# Patient Record
Sex: Female | Born: 1985
Health system: Southern US, Community
[De-identification: ages and names within clinical notes are randomized; demographics above are authoritative.]

## PROBLEM LIST (undated history)

## (undated) ENCOUNTER — Inpatient Hospital Stay (HOSPITAL_COMMUNITY): Payer: Self-pay

## (undated) DIAGNOSIS — F419 Anxiety disorder, unspecified: Secondary | ICD-10-CM

## (undated) DIAGNOSIS — N200 Calculus of kidney: Secondary | ICD-10-CM

## (undated) HISTORY — PX: BREAST RECONSTRUCTION: SHX9

## (undated) HISTORY — PX: BREAST CYST EXCISION: SHX579

---

## 2007-05-25 ENCOUNTER — Ambulatory Visit: Payer: Self-pay | Admitting: Obstetrics & Gynecology

## 2007-05-25 ENCOUNTER — Observation Stay (HOSPITAL_COMMUNITY): Admission: AD | Admit: 2007-05-25 | Discharge: 2007-05-26 | Payer: Self-pay | Admitting: Obstetrics & Gynecology

## 2007-05-26 ENCOUNTER — Ambulatory Visit: Payer: Self-pay | Admitting: Vascular Surgery

## 2011-04-28 NOTE — Discharge Summary (Signed)
Anna Marshall, Anna Marshall                ACCOUNT NO.:  [id number]   MEDICAL RECORD NO.:  [id number]          PATIENT TYPE:  OBV   LOCATION:  9317                          FACILITY:  [facility]   PHYSICIAN:  Lesly Dukes, M.D. DATE OF BIRTH:  August 02, 1986   DATE OF ADMISSION:  05/25/2007  DATE OF DISCHARGE:  05/26/2007                               DISCHARGE SUMMARY   ADMISSION DIAGNOSIS:  Rule out pyelonephritis, left-sided back pain,  possible kidney stone.   DISCHARGE DIAGNOSIS:  Likely kidney stone.   PERTINENT LABS:  Urinalysis with moderate blood, 100 protein, positive  nitrites.  Urine culture was negative. Wet prep was negative.  White  blood cell count was 9.8, hemoglobin 10.3. MRI of the abdomen, pelvis  showed slight fullness of the collecting system bilaterally.  There was  minimal amount of perinephric fluid on the left which may be related to  the result of recently passed stone versus mild pyelonephritis.   HOSPITAL COURSE:  This is a 25 year old gravida 1 at 19+ weeks who  presented with complaints of vaginal pain and left-sided back pain.  She  was seen at urgent care earlier that day and was diagnosed with a UTI  and given Macrobid. She continued to have left flank pain and presented  to MAU. She gave a history of subjective fevers and chills together with  the back pain.  She was admitted for observation to rule out  pyelonephritis versus a kidney stone. She was started on Rocephin 1 gram  q.12 hours, Dilaudid for pain management, given IV fluids and an  antiemetic. Her urine was strained looking for a kidney stone. Overnight  she remained afebrile.  She still complained of left-sided soreness that  had been gradually getting better with the pain medication. She also  complained of some pain in the left lower extremity. Given her recent  history of flying from [place] and the patient being pregnant lower  extremity Dopplers were done to rule out DVT which was negative.  The  patient remained stable.  Her pain was well-controlled with dilated and  was switched over to Percocet also well controlled on that. Her urine  culture was negative.  She was discharged home with instructions to  strain all urine looking for kidney stone. She is also in the process of  finding a primary OB doctor. [place] and believed a family  member has contact with an OB doctor there. She was given the number to  the [facility] in the event that she is unable to locate a  primary doctor.  She was discharged home on the following medicines.   DISCHARGE MEDICATIONS:  1. Percocet 5/325 mg take 1-2 tablets p.o. q.6 hours p.r.n. pain.  2. Macrobid 100 mg p.o. b.i.d. x7 days.  3. Prenatal vitamins daily.  4. Phenergan 12.5 mg q.6 hours p.r.n.     ______________________________  Paticia Stack, MD      Lesly Dukes, M.D.  Electronically Signed    LNJ/MEDQ  D:  07/24/2007  T:  07/24/2007  Job:  [id number]

## 2011-09-28 LAB — URINE CULTURE: Colony Count: NO GROWTH

## 2011-09-28 LAB — URINALYSIS, ROUTINE W REFLEX MICROSCOPIC
Bilirubin Urine: NEGATIVE
Ketones, ur: 15 — AB
Leukocytes, UA: NEGATIVE
Nitrite: POSITIVE — AB
Specific Gravity, Urine: 1.02
Urobilinogen, UA: >8 — ABNORMAL HIGH
pH: 6.5

## 2011-09-28 LAB — CBC
HCT: 29.5 — ABNORMAL LOW
Hemoglobin: 10.3 — ABNORMAL LOW
RBC: 3.24 — ABNORMAL LOW

## 2011-09-28 LAB — WET PREP, GENITAL: Clue Cells Wet Prep HPF POC: NONE SEEN

## 2011-09-28 LAB — URINE MICROSCOPIC-ADD ON

## 2015-07-02 ENCOUNTER — Inpatient Hospital Stay (HOSPITAL_COMMUNITY): Payer: 59

## 2015-07-02 ENCOUNTER — Inpatient Hospital Stay (HOSPITAL_COMMUNITY)
Admission: AD | Admit: 2015-07-02 | Discharge: 2015-07-02 | Disposition: A | Discharge status: Home / Self Care | Payer: 59 | Source: Ambulatory Visit | Attending: Obstetrics and Gynecology | Admitting: Obstetrics and Gynecology

## 2015-07-02 ENCOUNTER — Encounter (HOSPITAL_COMMUNITY): Payer: Self-pay | Admitting: *Deleted

## 2015-07-02 DIAGNOSIS — O26899 Other specified pregnancy related conditions, unspecified trimester: Secondary | ICD-10-CM

## 2015-07-02 DIAGNOSIS — R1032 Left lower quadrant pain: Secondary | ICD-10-CM | POA: Insufficient documentation

## 2015-07-02 DIAGNOSIS — Z87442 Personal history of urinary calculi: Secondary | ICD-10-CM | POA: Diagnosis not present

## 2015-07-02 DIAGNOSIS — F17210 Nicotine dependence, cigarettes, uncomplicated: Secondary | ICD-10-CM | POA: Diagnosis not present

## 2015-07-02 DIAGNOSIS — Z3A00 Weeks of gestation of pregnancy not specified: Secondary | ICD-10-CM | POA: Diagnosis not present

## 2015-07-02 DIAGNOSIS — O3680X0 Pregnancy with inconclusive fetal viability, not applicable or unspecified: Secondary | ICD-10-CM

## 2015-07-02 DIAGNOSIS — R109 Unspecified abdominal pain: Secondary | ICD-10-CM

## 2015-07-02 DIAGNOSIS — O9989 Other specified diseases and conditions complicating pregnancy, childbirth and the puerperium: Secondary | ICD-10-CM | POA: Insufficient documentation

## 2015-07-02 HISTORY — DX: Anxiety disorder, unspecified: F41.9

## 2015-07-02 HISTORY — DX: Calculus of kidney: N20.0

## 2015-07-02 LAB — URINALYSIS, ROUTINE W REFLEX MICROSCOPIC
Bilirubin Urine: NEGATIVE
Glucose, UA: NEGATIVE mg/dL
HGB URINE DIPSTICK: NEGATIVE
KETONES UR: NEGATIVE mg/dL
LEUKOCYTES UA: NEGATIVE
Nitrite: NEGATIVE
PH: 5 (ref 5.0–8.0)
Protein, ur: NEGATIVE mg/dL
SPECIFIC GRAVITY, URINE: 1.005 (ref 1.005–1.030)
UROBILINOGEN UA: 0.2 mg/dL (ref 0.0–1.0)

## 2015-07-02 LAB — CBC
HCT: 38.3 % (ref 36.0–46.0)
HEMOGLOBIN: 13.7 g/dL (ref 12.0–15.0)
MCH: 31.1 pg (ref 26.0–34.0)
MCHC: 35.8 g/dL (ref 30.0–36.0)
MCV: 87 fL (ref 78.0–100.0)
PLATELETS: 225 10*3/uL (ref 150–400)
RBC: 4.4 MIL/uL (ref 3.87–5.11)
RDW: 13.6 % (ref 11.5–15.5)
WBC: 9.7 10*3/uL (ref 4.0–10.5)

## 2015-07-02 LAB — WET PREP, GENITAL
CLUE CELLS WET PREP: NONE SEEN
TRICH WET PREP: NONE SEEN
YEAST WET PREP: NONE SEEN

## 2015-07-02 LAB — GC/CHLAMYDIA PROBE AMP (~~LOC~~) NOT AT ARMC
CHLAMYDIA, DNA PROBE: NEGATIVE
Neisseria Gonorrhea: NEGATIVE

## 2015-07-02 LAB — HCG, QUANTITATIVE, PREGNANCY: hCG, Beta Chain, Quant, S: 63 m[IU]/mL — ABNORMAL HIGH (ref <5)

## 2015-07-02 LAB — ABO/RH: ABO/RH(D): A POS

## 2015-07-02 LAB — POCT PREGNANCY, URINE: Preg Test, Ur: POSITIVE — AB

## 2015-07-02 NOTE — MAU Note (Signed)
Pt reports her IUD was removed on 06/22 and LMP was 06/09. Positive preg test on Monday. Has had lower abd cramping x 1-2 weeks, sharp shooting pain on left side for a few days.

## 2015-07-02 NOTE — Discharge Instructions (Signed)

## 2015-07-02 NOTE — MAU Provider Note (Signed)
Chief Complaint: No chief complaint on file.   First Provider Initiated Contact with Patient 07/02/15 [phone]      SUBJECTIVE HPI: Anna Marshall is a 29 y.o. [id number] at Unknown by LMP who presents to maternity admissions reporting LLQ abdominal pain x 1 week with positive HPT 4 days ago and labs in the office yesterday with quant hcg of 33.  She reports she had her Paraguard IUD out on 6/22, following her LMP on 6/9, then started having the sharp intermittent pain 1 week ago.  She called her MD office and was seen yesterday for labs. The pain became more severe today, bringing her to the MAU. She has tried Tylenol for the pain which has not helped.  She denies vaginal bleeding, vaginal itching/burning, urinary symptoms, h/a, dizziness, n/v, or fever/chills.     Abdominal Pain This is a new problem. The current episode started 1 to 4 weeks ago. The onset quality is sudden. The problem occurs intermittently. The most recent episode lasted 7 days. The problem has been gradually worsening. The pain is located in the LLQ. The pain is moderate. The quality of the pain is sharp. The abdominal pain does not radiate. Pertinent negatives include no constipation, diarrhea, dysuria, fever, frequency, headaches, nausea or vomiting. She has tried acetaminophen for the symptoms. The treatment provided no relief.    Past Medical History  Diagnosis Date  . Kidney stones   . Anxiety    Past Surgical History  Procedure Laterality Date  . Breast cyst excision    . Breast reconstruction     History   Social History  . Marital Status: Married    Spouse Name: N/A  . Number of Children: N/A  . Years of Education: N/A   Occupational History  . Not on file.   Social History Main Topics  . Smoking status: Current Every Day Smoker  . Smokeless tobacco: Not on file  . Alcohol Use: No  . Drug Use: No  . Sexual Activity: Yes    Birth Control/ Protection: None   Other Topics Concern  . Not on file    Social History Narrative  . No narrative on file   No current facility-administered medications on file prior to encounter.   No current outpatient prescriptions on file prior to encounter.   No Known Allergies  Review of Systems  Constitutional: Negative for fever, chills and malaise/fatigue.  Eyes: Negative for blurred vision.  Respiratory: Negative for cough and shortness of breath.   Cardiovascular: Negative for chest pain.  Gastrointestinal: Positive for abdominal pain. Negative for heartburn, nausea, vomiting, diarrhea and constipation.  Genitourinary: Negative for dysuria, urgency and frequency.  Musculoskeletal: Negative.   Neurological: Negative for dizziness and headaches.  Psychiatric/Behavioral: Negative for depression.    OBJECTIVE Blood pressure 123/77, pulse 115, resp. rate 16, height  (1.676 m), weight 50.803 kg (112 lb), last menstrual period 05/20/2015. GENERAL: Well-developed, well-nourished female in no acute distress.  EYES: normal sclera/conjunctiva; no lid-lag HENT: Atraumatic, normocephalic HEART: normal rate RESP: normal effort ABDOMEN: Soft, non-tender MUSCULOSKELETAL: Normal ROM EXTREMITIES: Nontender, no edema NEURO/PSYCH: Alert and oriented, appropriate affect  PELVIC EXAM: Cervix pink, visually closed, without lesion, scant white creamy discharge, vaginal walls and external genitalia normal Bimanual exam: Cervix 0/long/high, firm, anterior, neg CMT, uterus nontender, nonenlarged, adnexa without tenderness, enlargement, or mass   LAB RESULTS Results for orders placed or performed during the hospital encounter of 07/02/15 (from the past 24 hour(s))  Urinalysis, Routine w reflex  microscopic (not at [facility])     Status: Abnormal   Collection Time: 07/02/15 12:00 AM  Result Value Ref Range   Color, Urine STRAW (A) YELLOW   APPearance CLEAR CLEAR   Specific Gravity, Urine 1.005 1.005 - 1.030   pH 5.0 5.0 - 8.0   Glucose, UA NEGATIVE NEGATIVE  mg/dL   Hgb urine dipstick NEGATIVE NEGATIVE   Bilirubin Urine NEGATIVE NEGATIVE   Ketones, ur NEGATIVE NEGATIVE mg/dL   Protein, ur NEGATIVE NEGATIVE mg/dL   Urobilinogen, UA 0.2 0.0 - 1.0 mg/dL   Nitrite NEGATIVE NEGATIVE   Leukocytes, UA NEGATIVE NEGATIVE  Pregnancy, urine POC     Status: Abnormal   Collection Time: 07/02/15 12:40 AM  Result Value Ref Range   Preg Test, Ur POSITIVE (A) NEGATIVE  CBC     Status: None   Collection Time: 07/02/15 12:57 AM  Result Value Ref Range   WBC 9.7 4.0 - 10.5 K/uL   RBC 4.40 3.87 - 5.11 MIL/uL   Hemoglobin 13.7 12.0 - 15.0 g/dL   HCT 16.1 09.6 - 04.5 %   MCV 87.0 78.0 - 100.0 fL   MCH 31.1 26.0 - 34.0 pg   MCHC 35.8 30.0 - 36.0 g/dL   RDW 40.9 81.1 - 91.4 %   Platelets 225 150 - 400 K/uL  hCG, quantitative, pregnancy     Status: Abnormal   Collection Time: 07/02/15 12:57 AM  Result Value Ref Range   hCG, Beta Chain, Quant, S 63 (H) <5 mIU/mL  ABO/Rh     Status: None (Preliminary result)   Collection Time: 07/02/15 12:57 AM  Result Value Ref Range   ABO/RH(D) A POS   Wet prep, genital     Status: Abnormal   Collection Time: 07/02/15  3:00 AM  Result Value Ref Range   Yeast Wet Prep HPF POC NONE SEEN NONE SEEN   Trich, Wet Prep NONE SEEN NONE SEEN   Clue Cells Wet Prep HPF POC NONE SEEN NONE SEEN   WBC, Wet Prep HPF POC FEW (A) NONE SEEN    IMAGING US Ob Comp Less 14 Wks  07/02/2015   CLINICAL DATA:  29 year old female with prostate pregnancy test.  EXAM: OBSTETRIC <14 WK [place] AND TRANSVAGINAL OB US  TECHNIQUE: Both transabdominal and transvaginal ultrasound examinations were performed for complete evaluation of the gestation as well as the maternal uterus, adnexal regions, and pelvic cul-de-sac. Transvaginal technique was performed to assess early pregnancy.  COMPARISON:  None.  FINDINGS: The uterus appears unremarkable. No intrauterine pregnancy identified. The possibility of an ectopic pregnancy is not entirely excluded. Correlation  with serial HCG levels and follow-up with ultrasound is recommended.  The right ovary measures 3.1 x 1.5 x 2.1 cm and left ovary measures 2.3 x 2.6 by 3.9 cm. There is a 2.0 x 1.5 x 1.9 cm simple cyst in the right ovary. A cystic structure with surrounding flow within appearance of "Ring of fire" is noted in the left ovary, most compatible with a corpus luteum.  IMPRESSION: No intrauterine pregnancy identified. Correlation with clinical exam and follow-up with serial HCG levels and ultrasound recommended.  Probable left ovarian corpus luteum.   Electronically Signed   By: Elgie Collard M.D.   On: 07/02/2015 02:24   US Ob Transvaginal  07/02/2015   CLINICAL DATA:  29 year old female with prostate pregnancy test.  EXAM: OBSTETRIC <14 WK [place] AND TRANSVAGINAL OB US  TECHNIQUE: Both transabdominal and transvaginal ultrasound examinations were performed for complete evaluation  of the gestation as well as the maternal uterus, adnexal regions, and pelvic cul-de-sac. Transvaginal technique was performed to assess early pregnancy.  COMPARISON:  None.  FINDINGS: The uterus appears unremarkable. No intrauterine pregnancy identified. The possibility of an ectopic pregnancy is not entirely excluded. Correlation with serial HCG levels and follow-up with ultrasound is recommended.  The right ovary measures 3.1 x 1.5 x 2.1 cm and left ovary measures 2.3 x 2.6 by 3.9 cm. There is a 2.0 x 1.5 x 1.9 cm simple cyst in the right ovary. A cystic structure with surrounding flow within appearance of "Ring of fire" is noted in the left ovary, most compatible with a corpus luteum.  IMPRESSION: No intrauterine pregnancy identified. Correlation with clinical exam and follow-up with serial HCG levels and ultrasound recommended.  Probable left ovarian corpus luteum.   Electronically Signed   By: Elgie Collard M.D.   On: 07/02/2015 02:24    ASSESSMENT 1. Pregnancy of unknown anatomic location   2. Abdominal pain affecting pregnancy      PLAN Consult Dr Jackelyn Knife, reviewed assessment and labs Discharge home with ectopic precautions F/U in MAU in 48 hours for repeat quant hcg.  Pt reports concern about cost of ED visit. Discussed that labs are done in our clinic but that Monday is too late for recommended 48 hours.  Pt states understanding.   Return to MAU sooner as needed for emergencies    Medication List    TAKE these medications        clonazePAM 0.5 MG tablet  Commonly known as:  KLONOPIN  Take 0.25 mg by mouth 2 (two) times daily as needed for anxiety.       Follow-up Information    Follow up with [facility].   Specialty:  Obstetrics and Gynecology   Why:  Start prenatal care as soon as possible   Contact information:   [address] [place] [address] [phone]      Follow up with [facility] [place] MATERNITY ADMISSIONS.   Why:  In 48 hours for repeat labs or sooner as needed   Contact information:   [address] [id number] mc [place] [address] [phone]      Sharen Counter Certified Nurse-Midwife 07/02/2015  3:39 AM   Addendum: After talking with pt, she reports her physician is Dr Senaida Ores in [place], not Dr Senaida Ores at [facility].  She plans to move care to [facility] and was given list of providers in [place].

## 2015-08-08 ENCOUNTER — Inpatient Hospital Stay (HOSPITAL_COMMUNITY): Payer: Managed Care, Other (non HMO)

## 2015-08-08 ENCOUNTER — Inpatient Hospital Stay (HOSPITAL_COMMUNITY)
Admission: AD | Admit: 2015-08-08 | Discharge: 2015-08-08 | Disposition: A | Discharge status: Home / Self Care | Payer: Managed Care, Other (non HMO) | Source: Ambulatory Visit | Attending: Obstetrics & Gynecology | Admitting: Obstetrics & Gynecology

## 2015-08-08 ENCOUNTER — Encounter (HOSPITAL_COMMUNITY): Payer: Self-pay | Admitting: *Deleted

## 2015-08-08 DIAGNOSIS — M549 Dorsalgia, unspecified: Secondary | ICD-10-CM

## 2015-08-08 DIAGNOSIS — N831 Corpus luteum cyst: Secondary | ICD-10-CM | POA: Diagnosis not present

## 2015-08-08 DIAGNOSIS — O209 Hemorrhage in early pregnancy, unspecified: Secondary | ICD-10-CM

## 2015-08-08 DIAGNOSIS — O9989 Other specified diseases and conditions complicating pregnancy, childbirth and the puerperium: Secondary | ICD-10-CM

## 2015-08-08 DIAGNOSIS — O26899 Other specified pregnancy related conditions, unspecified trimester: Secondary | ICD-10-CM

## 2015-08-08 DIAGNOSIS — Z3A09 9 weeks gestation of pregnancy: Secondary | ICD-10-CM | POA: Insufficient documentation

## 2015-08-08 DIAGNOSIS — O4691 Antepartum hemorrhage, unspecified, first trimester: Secondary | ICD-10-CM

## 2015-08-08 DIAGNOSIS — O26891 Other specified pregnancy related conditions, first trimester: Secondary | ICD-10-CM | POA: Insufficient documentation

## 2015-08-08 DIAGNOSIS — O99891 Other specified diseases and conditions complicating pregnancy: Secondary | ICD-10-CM

## 2015-08-08 LAB — HCG, QUANTITATIVE, PREGNANCY: hCG, Beta Chain, Quant, S: 107345 m[IU]/mL — ABNORMAL HIGH (ref <5)

## 2015-08-08 LAB — COMPREHENSIVE METABOLIC PANEL
ALK PHOS: 39 U/L (ref 38–126)
ALT: 15 U/L (ref 14–54)
ANION GAP: 6 (ref 5–15)
AST: 15 U/L (ref 15–41)
Albumin: 4 g/dL (ref 3.5–5.0)
BILIRUBIN TOTAL: 0.7 mg/dL (ref 0.3–1.2)
BUN: 14 mg/dL (ref 6–20)
CALCIUM: 9.1 mg/dL (ref 8.9–10.3)
CO2: 26 mmol/L (ref 22–32)
CREATININE: 0.65 mg/dL (ref 0.44–1.00)
Chloride: 105 mmol/L (ref 101–111)
Glucose, Bld: 84 mg/dL (ref 65–99)
Potassium: 4.2 mmol/L (ref 3.5–5.1)
Sodium: 137 mmol/L (ref 135–145)
TOTAL PROTEIN: 6.7 g/dL (ref 6.5–8.1)

## 2015-08-08 LAB — CBC
HEMATOCRIT: 35.2 % — AB (ref 36.0–46.0)
HEMOGLOBIN: 12.4 g/dL (ref 12.0–15.0)
MCH: 30.8 pg (ref 26.0–34.0)
MCHC: 35.2 g/dL (ref 30.0–36.0)
MCV: 87.6 fL (ref 78.0–100.0)
Platelets: 267 10*3/uL (ref 150–400)
RBC: 4.02 MIL/uL (ref 3.87–5.11)
RDW: 13.6 % (ref 11.5–15.5)
WBC: 11.2 10*3/uL — ABNORMAL HIGH (ref 4.0–10.5)

## 2015-08-08 LAB — URINALYSIS, ROUTINE W REFLEX MICROSCOPIC
Bilirubin Urine: NEGATIVE
GLUCOSE, UA: NEGATIVE mg/dL
HGB URINE DIPSTICK: NEGATIVE
Ketones, ur: NEGATIVE mg/dL
LEUKOCYTES UA: NEGATIVE
Nitrite: NEGATIVE
PH: 6 (ref 5.0–8.0)
PROTEIN: NEGATIVE mg/dL
Specific Gravity, Urine: 1.025 (ref 1.005–1.030)
Urobilinogen, UA: 0.2 mg/dL (ref 0.0–1.0)

## 2015-08-08 MED ORDER — CYCLOBENZAPRINE HCL 10 MG PO TABS: 10.0000 mg | ORAL_TABLET | Freq: Two times a day (BID) | ORAL | Status: AC | PRN

## 2015-08-08 NOTE — MAU Note (Signed)
Pt C/O brown blood with wiping since yesterday, also back pain & abd cramping that started this morning.

## 2015-08-08 NOTE — Discharge Instructions (Signed)

## 2015-08-08 NOTE — MAU Provider Note (Signed)
History     CSN: [id number]  Arrival date and time: 08/08/15 1319   None     Chief Complaint  Patient presents with  . Vaginal Discharge  . Back Pain   HPI  Pt is G5P2012 at [redacted]w[redacted]d pregnant who presents with brown blood when wiping accompanied by abd cramping and low back pain that started this morning. Pt was initially seen on 07/02/2015 with c/o intermittent  Mod LLQ pain for 1 week and +HPT - her HCG was 63 Her ultrasound showed no IUP with cystic structure with surrounding flow within appearance of "Ring of fire" is noted in the left ovary, most compatible with corpus luteum- ectopic pregnancy not entirely excluded. Probable left CLC  Pt was scheduled for repeat HCG and did not return- pt has been getting her OB care in [place] and says she has had an ultrasound for dating. Pt has hx of kidney stones but does not think this feel like this. Pt denies UTI sx.  Pt says nausea and vomiting and breast tenderness went away at about 8 weeks and feels good now. Pt had negative wet prep and GC/chlamydia when here on 07/02/2015 and has also been checked at [facility] in [place]. Pt last had sex last weekend- no pain or bleeding afterwards.  RN note: Pt C/O brown blood with wiping since yesterday, also back pain & abd cramping that started this morning.          Past Medical History  Diagnosis Date  . Anxiety   . Kidney stones 2008 last occurance    Past Surgical History  Procedure Laterality Date  . Breast cyst excision    . Breast reconstruction      History reviewed. No pertinent family history.  Social History  Substance Use Topics  . Smoking status: Former Games developer  . Smokeless tobacco: None  . Alcohol Use: No    Allergies: No Known Allergies  Prescriptions prior to admission  Medication Sig Dispense Refill Last Dose  . clonazePAM (KLONOPIN) 0.5 MG tablet Take 0.25 mg by mouth 2 (two) times daily as needed for anxiety.   07/01/2015 at Unknown time    Review of Systems   Constitutional: Negative for fever and chills.  Respiratory: Negative for cough.   Gastrointestinal: Positive for abdominal pain. Negative for nausea, vomiting, diarrhea and constipation.  Genitourinary: Negative for dysuria and urgency.  Musculoskeletal: Positive for back pain.  Neurological: Negative for headaches.   Physical Exam   Blood pressure 96/60, pulse 94, temperature 97.8 F (36.6 C), temperature source Oral, resp. rate 16, height 5\' 6"  (1.676 m), weight 112 lb 6.4 oz (50.984 kg), last menstrual period 05/20/2015.  Physical Exam  Nursing note and vitals reviewed. Constitutional: She is oriented to person, place, and time. She appears well-developed and well-nourished. No distress.  HENT:  Head: Normocephalic.  Eyes: Pupils are equal, round, and reactive to light.  Neck: Normal range of motion. Neck supple.  Cardiovascular: Normal rate.   Respiratory: Effort normal.  No CVA tenderness  GI: Soft. She exhibits no distension. There is no tenderness. There is no rebound and no guarding.  Genitourinary:  Mod amount of creamy white discharge in vault; cervix clean, closed, long  Musculoskeletal: Normal range of motion.  Neurological: She is alert and oriented to person, place, and time.  Skin: Skin is warm and dry.  Psychiatric: She has a normal mood and affect.    MAU Course  Procedures Results for orders placed or performed during the hospital  encounter of 08/08/15 (from the past 24 hour(s))  Urinalysis, Routine w reflex microscopic (not at [facility])     Status: None   Collection Time: 08/08/15  1:40 PM  Result Value Ref Range   Color, Urine YELLOW YELLOW   APPearance CLEAR CLEAR   Specific Gravity, Urine 1.025 1.005 - 1.030   pH 6.0 5.0 - 8.0   Glucose, UA NEGATIVE NEGATIVE mg/dL   Hgb urine dipstick NEGATIVE NEGATIVE   Bilirubin Urine NEGATIVE NEGATIVE   Ketones, ur NEGATIVE NEGATIVE mg/dL   Protein, ur NEGATIVE NEGATIVE mg/dL   Urobilinogen, UA 0.2 0.0 - 1.0 mg/dL    Nitrite NEGATIVE NEGATIVE   Leukocytes, UA NEGATIVE NEGATIVE  hCG, quantitative, pregnancy     Status: Abnormal   Collection Time: 08/08/15  1:50 PM  Result Value Ref Range   hCG, Beta Chain, Quant, S 107345 (H) <5 mIU/mL  CBC     Status: Abnormal   Collection Time: 08/08/15  1:50 PM  Result Value Ref Range   WBC 11.2 (H) 4.0 - 10.5 K/uL   RBC 4.02 3.87 - 5.11 MIL/uL   Hemoglobin 12.4 12.0 - 15.0 g/dL   HCT 47.8 (L) 29.5 - 62.1 %   MCV 87.6 78.0 - 100.0 fL   MCH 30.8 26.0 - 34.0 pg   MCHC 35.2 30.0 - 36.0 g/dL   RDW 30.8 65.7 - 84.6 %   Platelets 267 150 - 400 K/uL  Comprehensive metabolic panel     Status: None   Collection Time: 08/08/15  1:50 PM  Result Value Ref Range   Sodium 137 135 - 145 mmol/L   Potassium 4.2 3.5 - 5.1 mmol/L   Chloride 105 101 - 111 mmol/L   CO2 26 22 - 32 mmol/L   Glucose, Bld 84 65 - 99 mg/dL   BUN 14 6 - 20 mg/dL   Creatinine, Ser 9.62 0.44 - 1.00 mg/dL   Calcium 9.1 8.9 - 95.2 mg/dL   Total Protein 6.7 6.5 - 8.1 g/dL   Albumin 4.0 3.5 - 5.0 g/dL   AST 15 15 - 41 U/L   ALT 15 14 - 54 U/L   Alkaline Phosphatase 39 38 - 126 U/L   Total Bilirubin 0.7 0.3 - 1.2 mg/dL   GFR calc non Af Amer >60 >60 mL/min   GFR calc Af Amer >60 >60 mL/min   Anion gap 6 5 - 15   US Ob Transvaginal  08/08/2015   CLINICAL DATA:  Pregnant, bleeding  EXAM: TRANSVAGINAL OB ULTRASOUND  TECHNIQUE: Transvaginal ultrasound was performed for complete evaluation of the gestation as well as the maternal uterus, adnexal regions, and pelvic cul-de-sac.  COMPARISON:  07/02/2015  FINDINGS: Intrauterine gestational sac: Visualized/normal in shape.  Yolk sac:  Present  Embryo:  Present  Cardiac Activity: Present  Heart Rate: 162 bpm  CRL:   25  mm   9 w 2 d                  [place] EDC: 03/10/2014  Maternal uterus/adnexae: No subchronic hemorrhage.  Left ovary is within normal limits, noting a corpus luteal cyst.  Right ovary is notable for a 2.0 x 1.2 x 1.7 cm paraovarian cyst with  layering hemorrhage.  No free fluid.  IMPRESSION: Single live intrauterine gestation with estimated gestational age [redacted] weeks 2 days by crown-rump length.   Electronically Signed   By: Charline Bills M.D.   On: 08/08/2015 15:51   Assessment and Plan  Abdominal  pain in pregnancy- 1st trimester Back pain in pregnancy- back exercises and Rx for Flexeril if needed SLIUP [redacted]w[redacted]d pregnant with left CLC and right paraovarian cyst with layering hemorrhage F/u with OB care as scheduled in [place] Discussed need to d/c klonepin - alternative medicine can be prescribed by OB-(Atarax/Vistaril discussed with pt)  Isabella Roemmich 08/08/2015, 2:03 PM

## 2015-10-19 ENCOUNTER — Other Ambulatory Visit (HOSPITAL_COMMUNITY): Payer: Self-pay | Admitting: Obstetrics and Gynecology

## 2015-10-19 DIAGNOSIS — O283 Abnormal ultrasonic finding on antenatal screening of mother: Secondary | ICD-10-CM

## 2015-10-19 DIAGNOSIS — Z3689 Encounter for other specified antenatal screening: Secondary | ICD-10-CM

## 2015-10-19 DIAGNOSIS — Z3A20 20 weeks gestation of pregnancy: Secondary | ICD-10-CM

## 2015-10-26 ENCOUNTER — Encounter (HOSPITAL_COMMUNITY): Payer: Self-pay

## 2015-10-26 ENCOUNTER — Other Ambulatory Visit (HOSPITAL_COMMUNITY): Payer: Self-pay | Admitting: Obstetrics and Gynecology

## 2015-10-26 ENCOUNTER — Ambulatory Visit (HOSPITAL_COMMUNITY)
Admission: RE | Admit: 2015-10-26 | Discharge: 2015-10-26 | Disposition: A | Discharge status: Home / Self Care | Payer: Managed Care, Other (non HMO) | Source: Ambulatory Visit | Attending: Obstetrics and Gynecology | Admitting: Obstetrics and Gynecology

## 2015-10-26 DIAGNOSIS — O283 Abnormal ultrasonic finding on antenatal screening of mother: Secondary | ICD-10-CM

## 2015-10-26 DIAGNOSIS — Z3689 Encounter for other specified antenatal screening: Secondary | ICD-10-CM

## 2015-10-26 DIAGNOSIS — Z36 Encounter for antenatal screening of mother: Secondary | ICD-10-CM | POA: Diagnosis not present

## 2015-10-26 DIAGNOSIS — O358XX0 Maternal care for other (suspected) fetal abnormality and damage, not applicable or unspecified: Secondary | ICD-10-CM | POA: Insufficient documentation

## 2015-10-26 DIAGNOSIS — Z3A20 20 weeks gestation of pregnancy: Secondary | ICD-10-CM | POA: Insufficient documentation

## 2016-05-08 IMAGING — US US OB TRANSVAGINAL
1 series · 14 of 28 positions shown · non-contrast
Comparison: None.

CLINICAL DATA: 28-year-old female with prostate pregnancy test.

EXAM:
OBSTETRIC <14 WK US AND TRANSVAGINAL OB US
TECHNIQUE: Both transabdominal and transvaginal ultrasound examinations were
performed for complete evaluation of the gestation as well as the
maternal uterus, adnexal regions, and pelvic cul-de-sac.
Transvaginal technique was performed to assess early pregnancy.

[Series 1: us ob comp less 14 wk · 55 acquisitions, 14 frames shown]
[im 3/55]
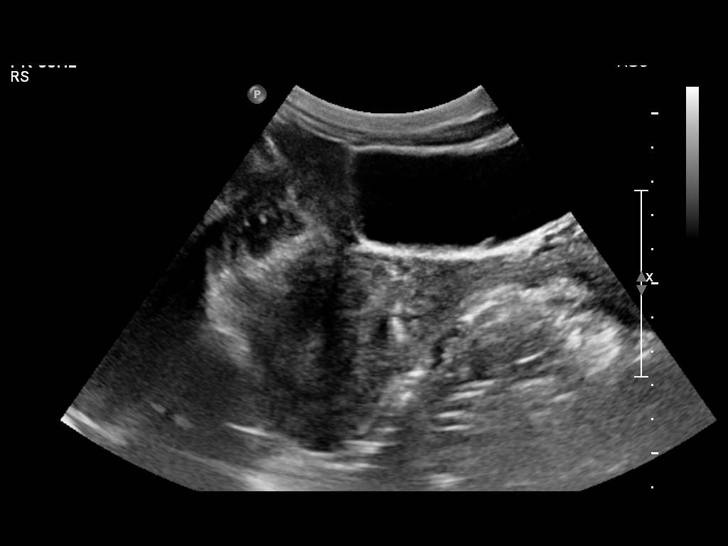
[im 7/55]
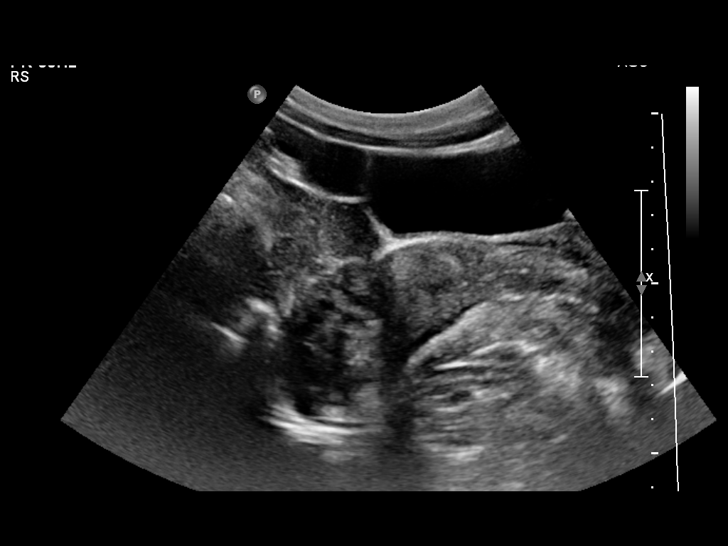
[im 11/55]
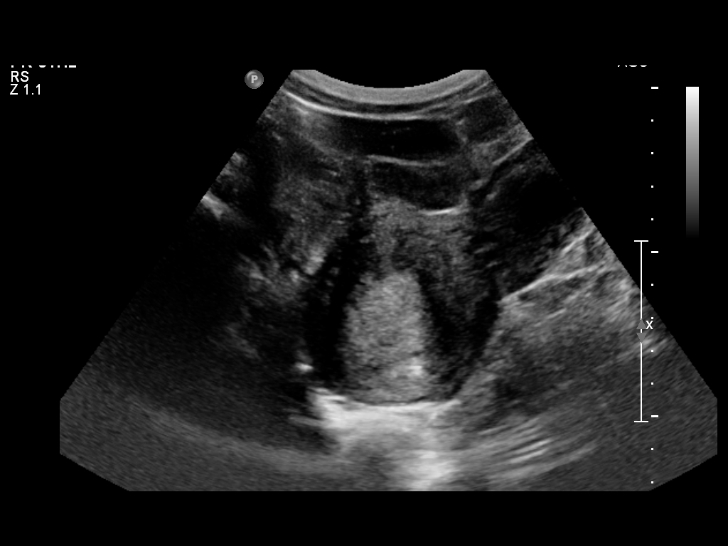
[im 15/55]
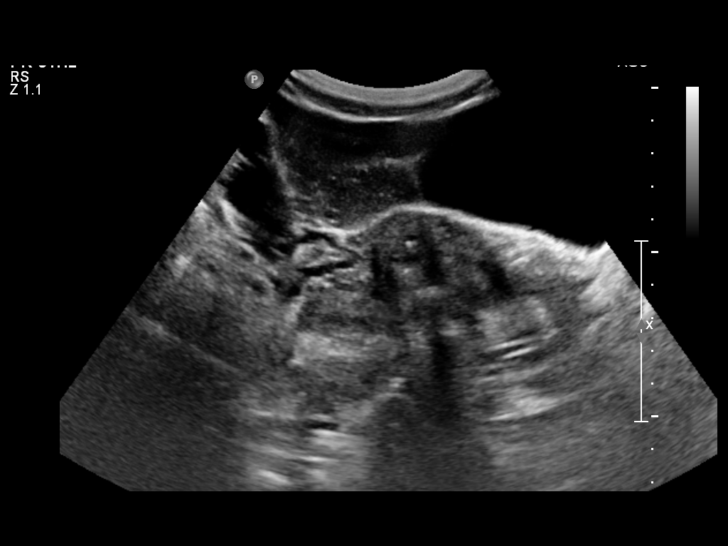
[im 19/55]
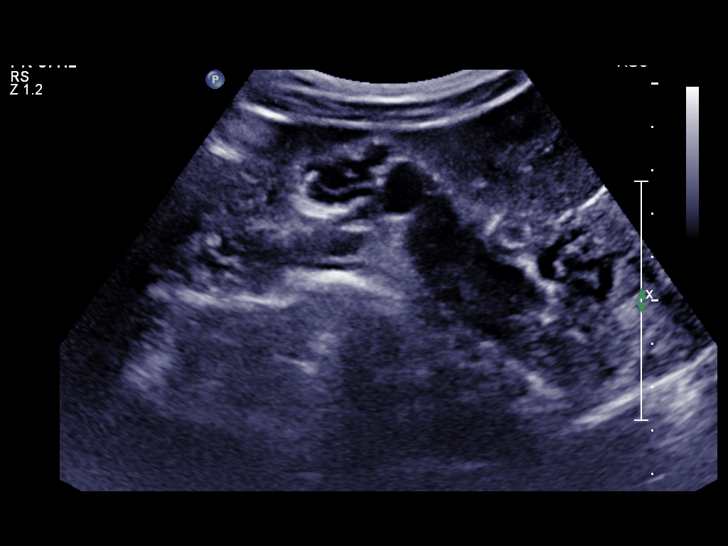
[im 23/55]
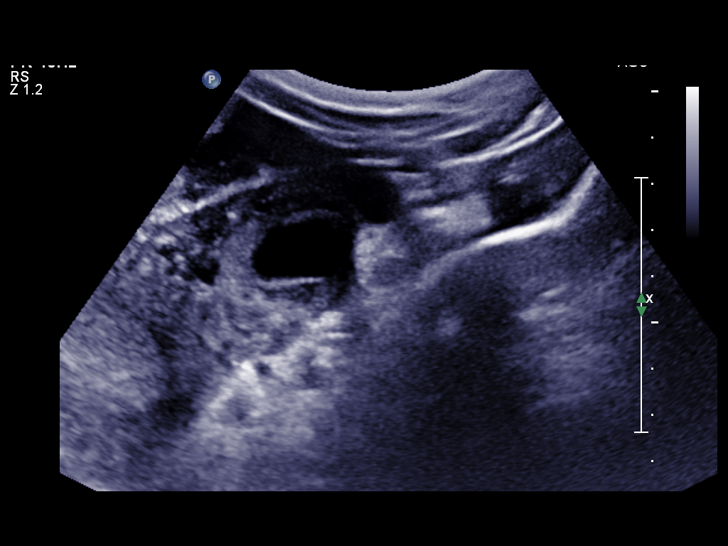
[im 27/55]
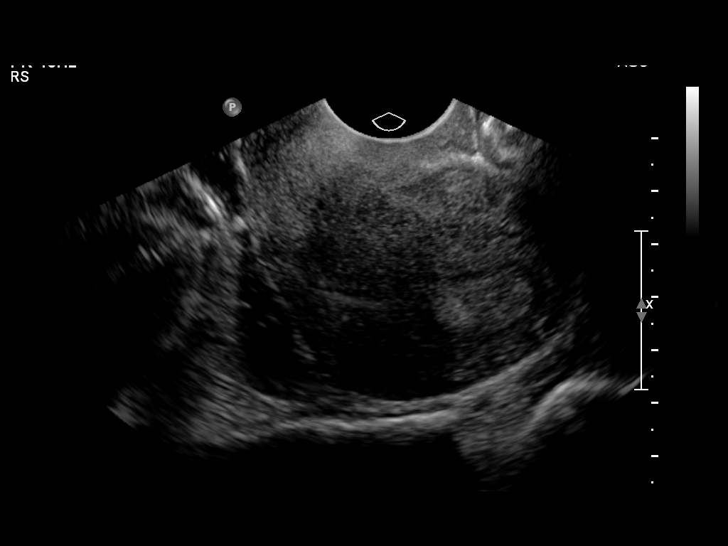
[im 31/55]
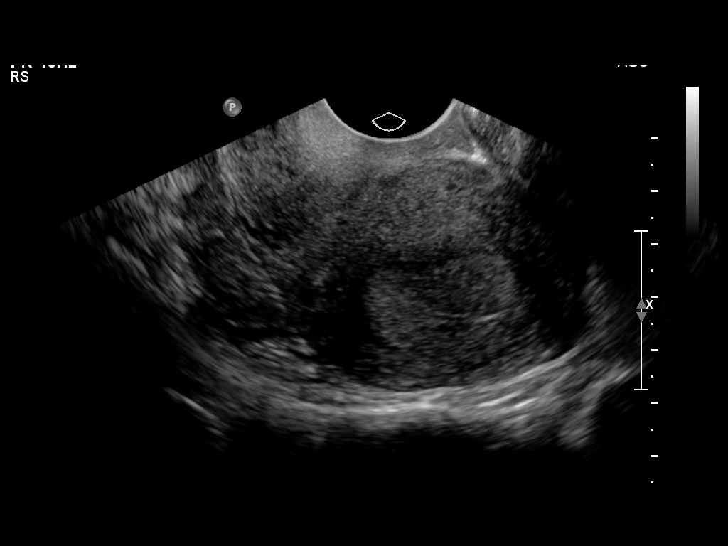
[im 35/55]
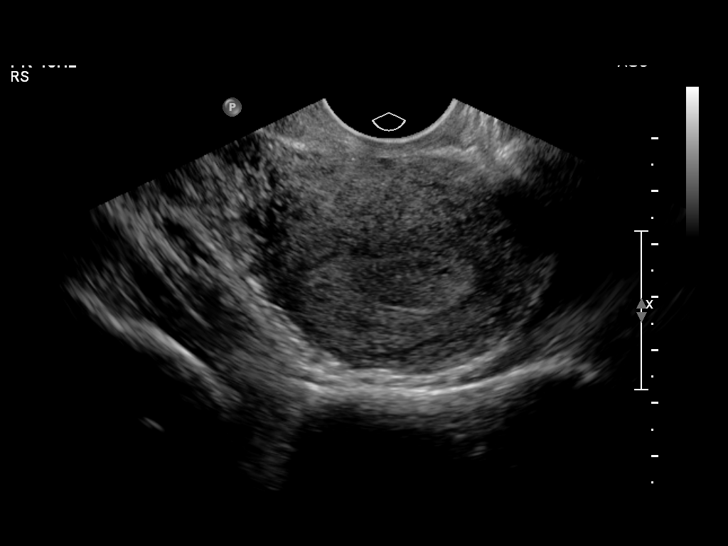
[im 39/55]
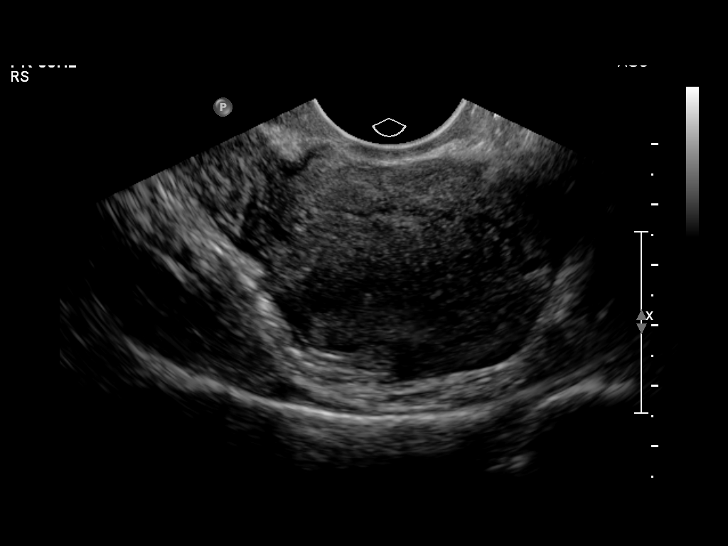
[im 43/55]
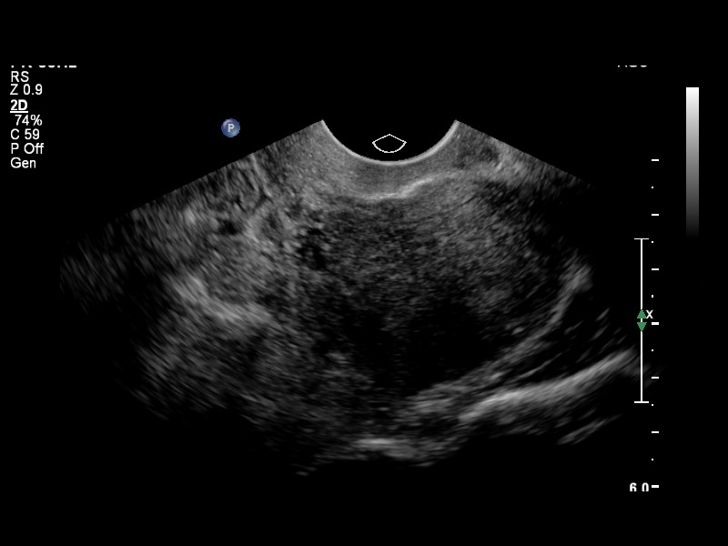
[im 47/55]
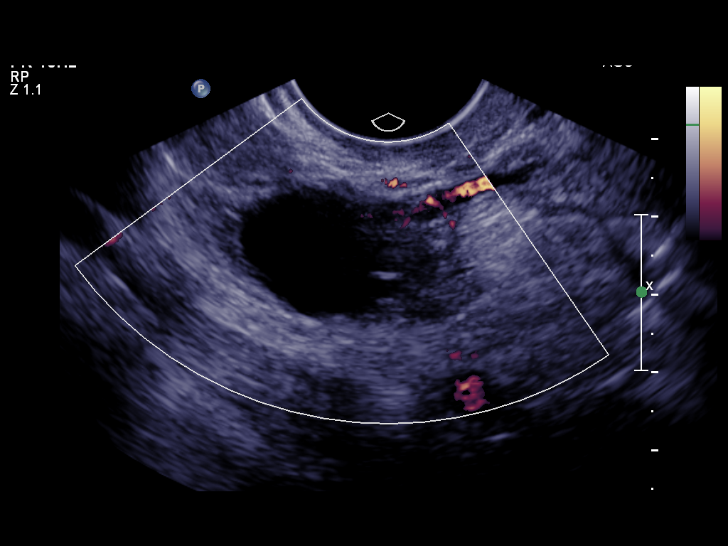
[im 51/55]
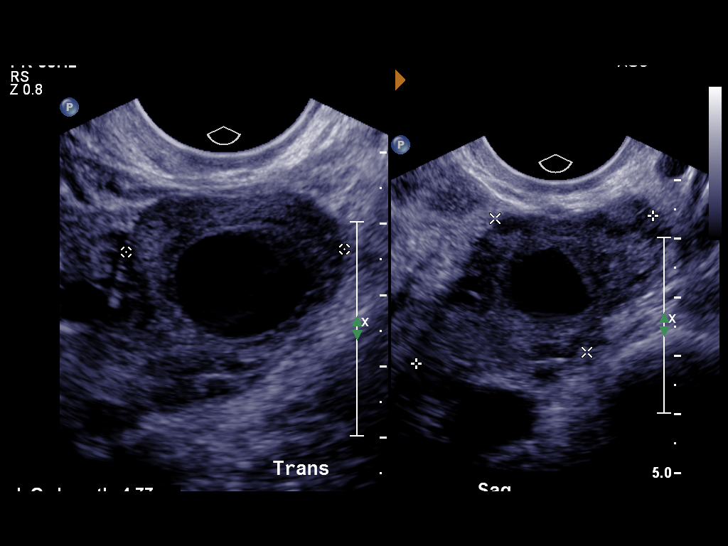
[im 55/55]
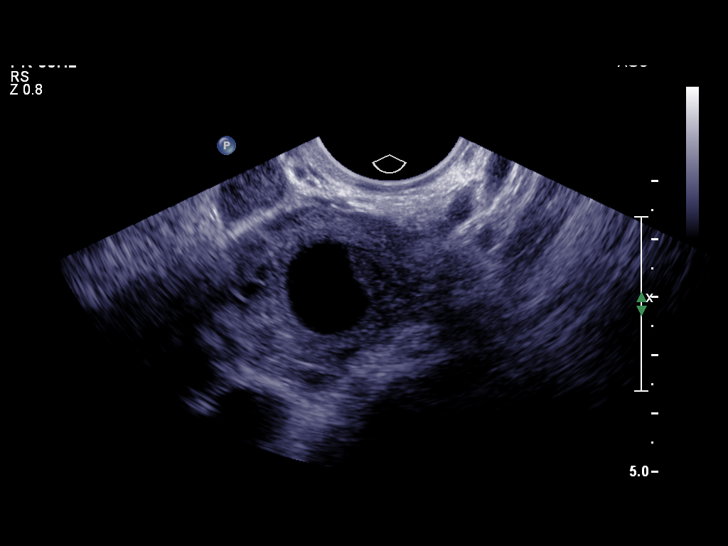

[14 of 28 positions shown; findings below may reference images not displayed]

FINDINGS: The uterus appears unremarkable. No intrauterine pregnancy
identified. The possibility of an ectopic pregnancy is not entirely
excluded. Correlation with serial HCG levels and follow-up with
ultrasound is recommended.

The right ovary measures 3.1 x 1.5 x 2.1 cm and left ovary measures
2.3 x 2.6 by 3.9 cm. There is a 2.0 x 1.5 x 1.9 cm simple cyst in
the right ovary. A cystic structure with surrounding flow within
appearance of "Ring of fire" is noted in the left ovary, most
compatible with a corpus luteum.
IMPRESSION: No intrauterine pregnancy identified. Correlation with clinical exam
and follow-up with serial HCG levels and ultrasound recommended.

Probable left ovarian corpus luteum.

## 2016-06-12 ENCOUNTER — Encounter (HOSPITAL_COMMUNITY): Payer: Self-pay | Admitting: *Deleted

## 2016-06-14 IMAGING — US US OB TRANSVAGINAL
1 series · 14 of 28 positions shown · non-contrast
Comparison: 07/02/2015

CLINICAL DATA: Pregnant, bleeding

EXAM:
TRANSVAGINAL OB ULTRASOUND
TECHNIQUE: Transvaginal ultrasound was performed for complete evaluation of the
gestation as well as the maternal uterus, adnexal regions, and
pelvic cul-de-sac.

[Series 1: us ob follow up · 35 acquisitions, 14 frames shown]
[im 2/35]
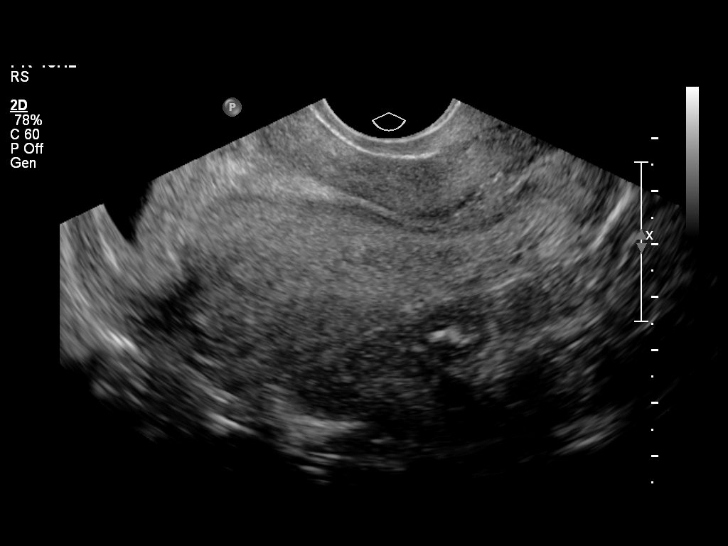
[im 4/35]
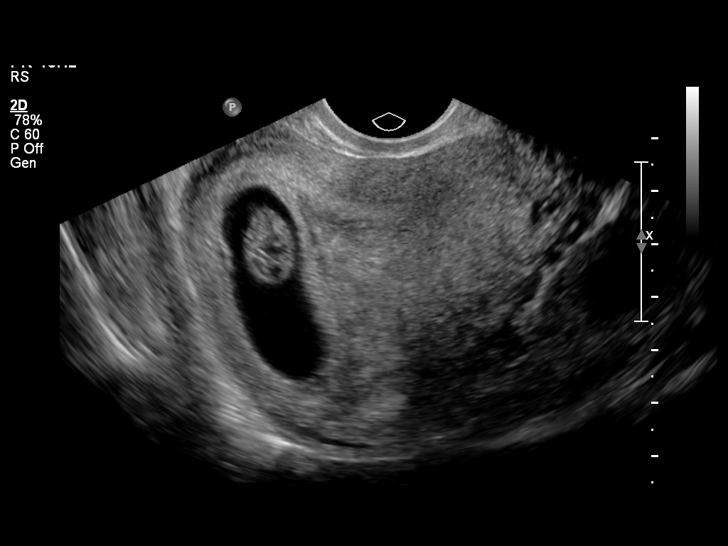
[im 7/35]
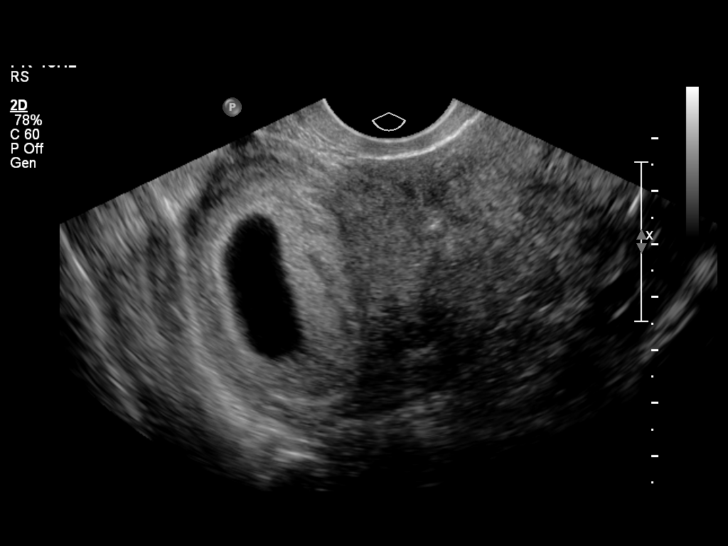
[im 9/35]
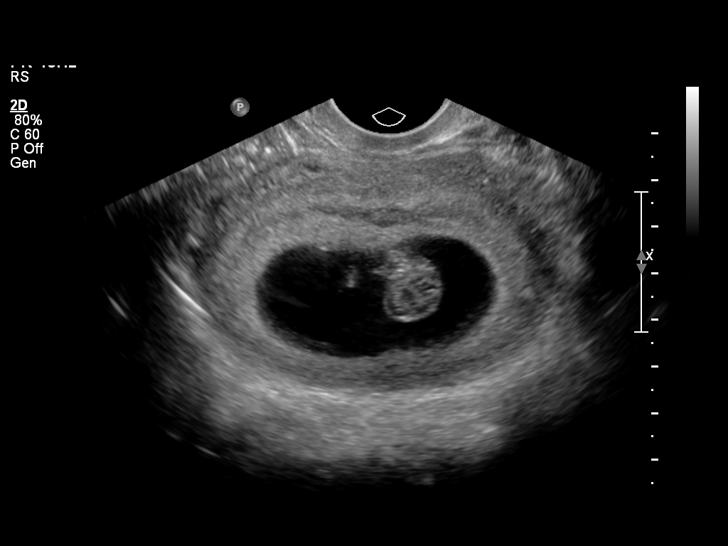
[im 12/35]
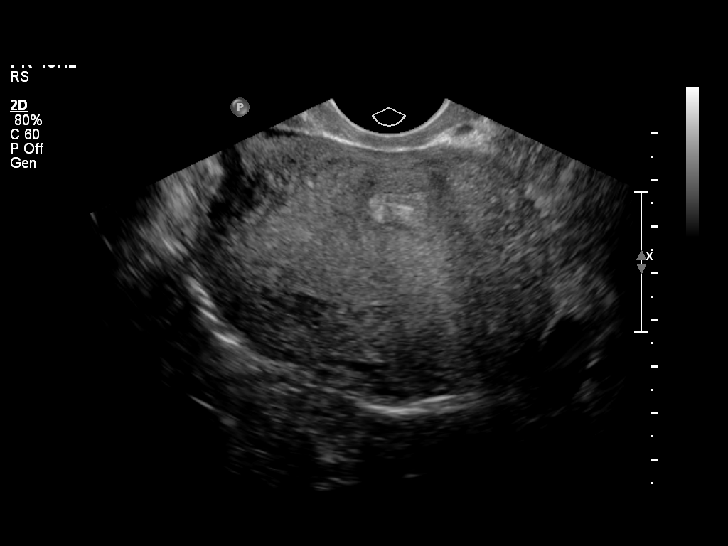
[im 14/35]
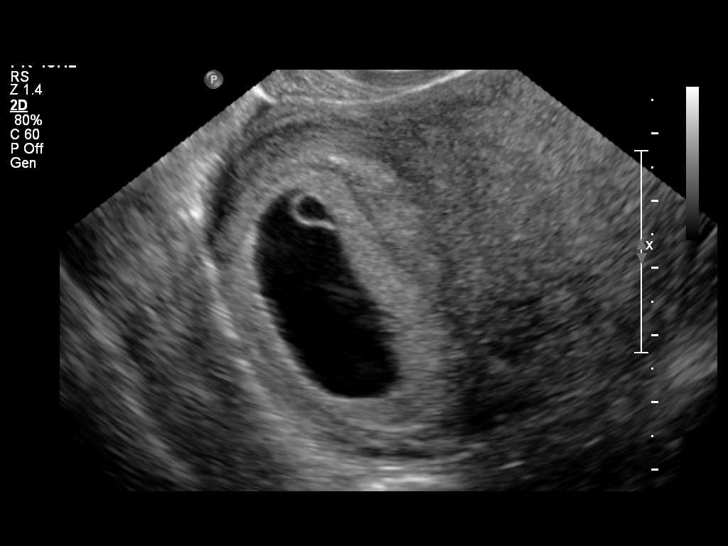
[im 17/35]
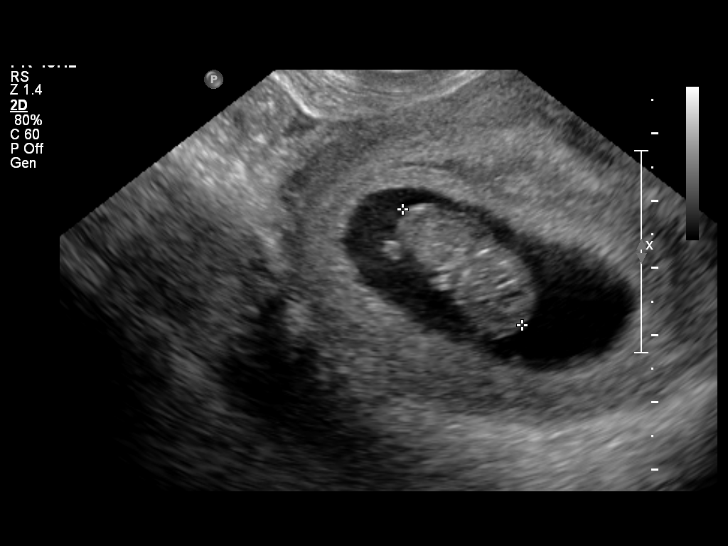
[im 19/35]
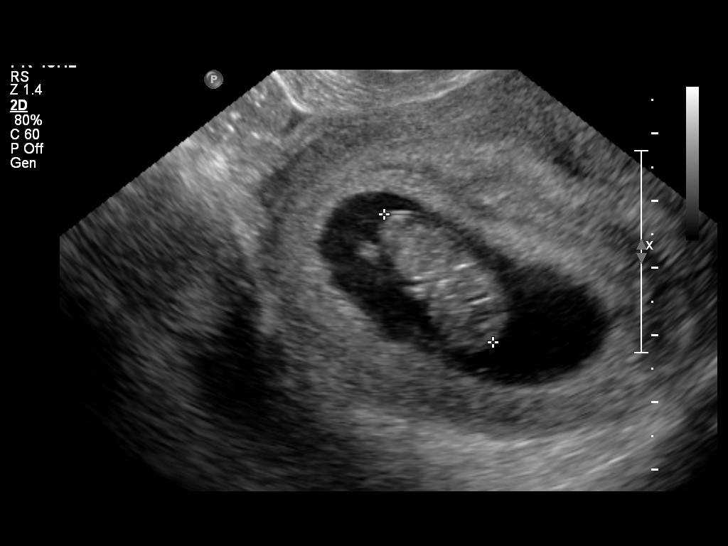
[im 22/35]
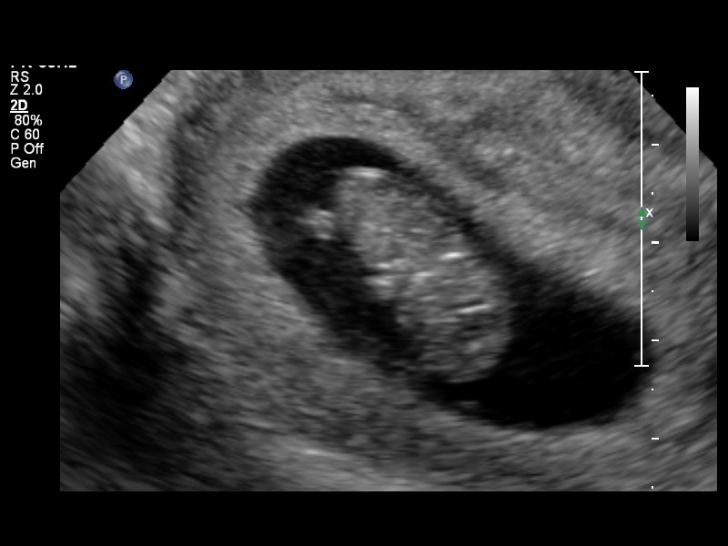
[im 24/35]
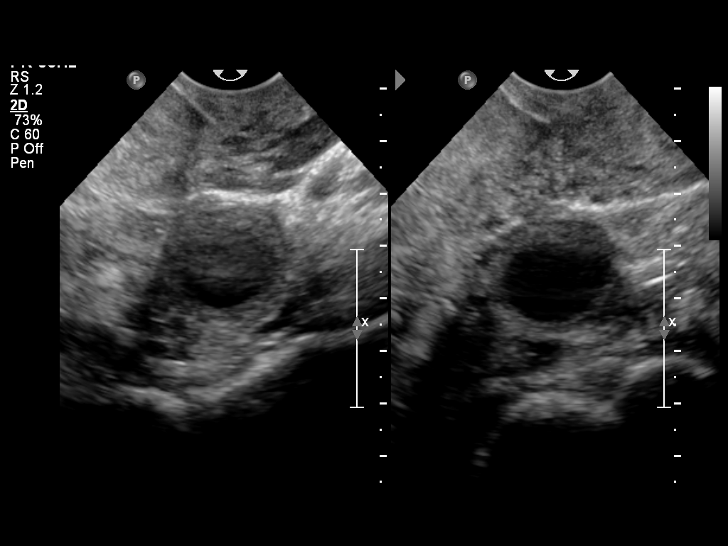
[im 27/35]
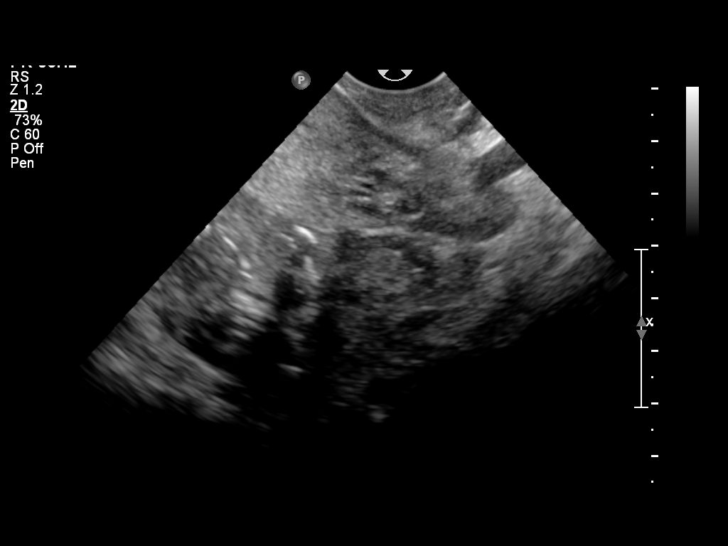
[im 29/35]
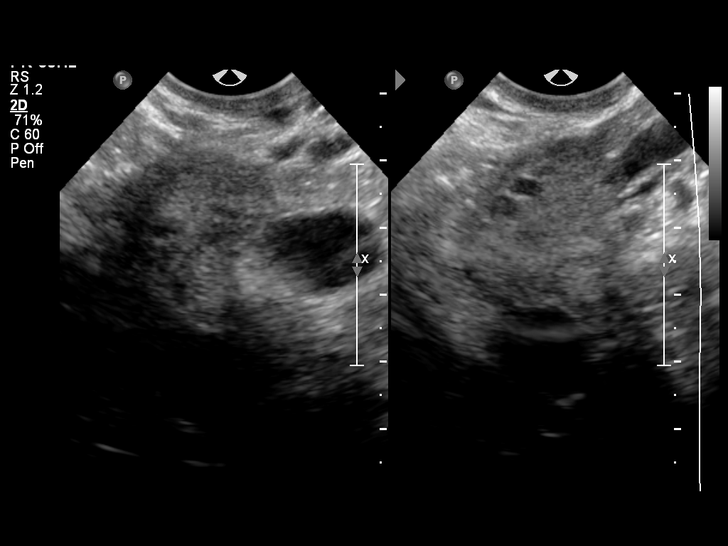
[im 32/35]
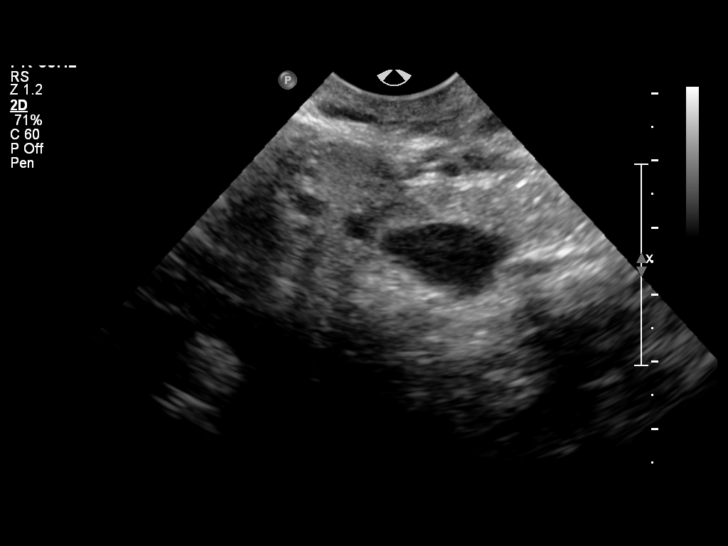
[im 35/35]
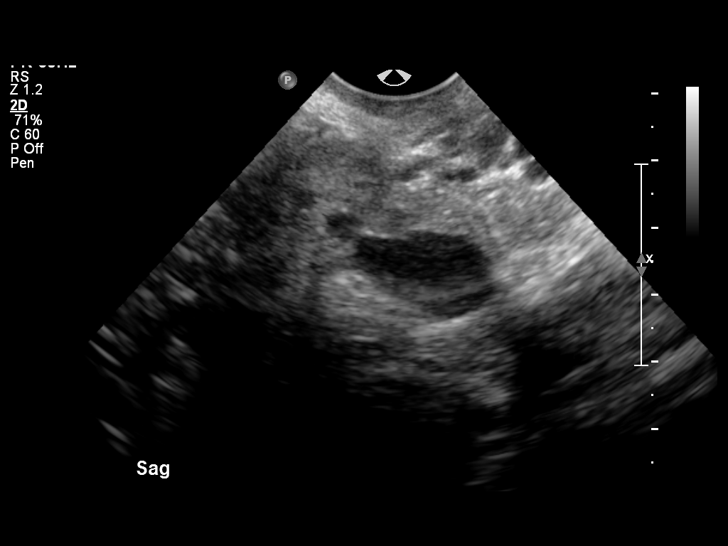

[14 of 28 positions shown; findings below may reference images not displayed]

FINDINGS: Intrauterine gestational sac: Visualized/normal in shape.

Yolk sac:  Present

Embryo:  Present

Cardiac Activity: Present

Heart Rate: 162 bpm

CRL:   25  mm   9 w 2 d                  US EDC: 03/10/2014

Maternal uterus/adnexae: No subchronic hemorrhage.

Left ovary is within normal limits, noting a corpus luteal cyst.

Right ovary is notable for a 2.0 x 1.2 x 1.7 cm paraovarian cyst
with layering hemorrhage.

No free fluid.
IMPRESSION: Single live intrauterine gestation with estimated gestational age 9
weeks 2 days by crown-rump length.

## 2017-04-11 ENCOUNTER — Other Ambulatory Visit: Payer: Self-pay | Admitting: Obstetrics & Gynecology

## 2017-04-13 LAB — CYTOLOGY - PAP
